# Patient Record
Sex: Male | Born: 1985 | Race: White | Hispanic: No | Marital: Married | Smoking: Former smoker
Health system: Southern US, Community
[De-identification: ages and names within clinical notes are randomized; demographics above are authoritative.]

## PROBLEM LIST (undated history)

## (undated) DIAGNOSIS — J45909 Unspecified asthma, uncomplicated: Secondary | ICD-10-CM

---
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
## 2005-03-19 ENCOUNTER — Emergency Department (HOSPITAL_COMMUNITY): Admission: EM | Admit: 2005-03-19 | Discharge: 2005-03-19 | Payer: Self-pay | Admitting: Emergency Medicine
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
## 2007-07-31 ENCOUNTER — Emergency Department (HOSPITAL_COMMUNITY): Admission: EM | Admit: 2007-07-31 | Discharge: 2007-07-31 | Payer: Self-pay | Admitting: Emergency Medicine

## 2010-07-25 NOTE — Discharge Summary (Signed)
   NAME:  Devin Murphy, Devin Murphy                           ACCOUNT NO.:  [id number]   MEDICAL RECORD NO.:  [id number]                   PATIENT TYPE:  INP   LOCATION:  A315                                 FACILITY:  [facility]   PHYSICIAN:  Kingsley Callander. Ouida Sills, M.D.                  DATE OF BIRTH:  [birth year]   DATE OF ADMISSION:  03/28/2002  DATE OF DISCHARGE:  03/29/2002                                 DISCHARGE SUMMARY   DISCHARGE DIAGNOSES:  1. Acute gastroenteritis.  2. Dehydration.   HOSPITAL COURSE:  This patient is a 25 year old white male who presented to  the emergency room with nausea, vomiting and diarrhea.  His white count was  16.4.  His BUN and creatinine were 15 and 0.8.  His AST was mildly elevated  at 46.  His ALT was normal at 32.  His urinalysis was negative.  He had a  rapid Strep test which was negative.  He was felt to likely have an acute  gastroenteritis.  He was hydrated with 3 L in the emergency room over an  eight-hour period and remained orthostatic; he was therefore hospitalized  for observation and further treatment.  With IV hydration, his symptoms  improved.  His diet was advanced from clear liquids to full liquids, which  he tolerated well.  He was improved and stable for discharge on March 29, 2001.   He underwent a CT scan of the abdomen while in the emergency room, which  revealed a few mildly enlarged mesenteric lymph nodes possibly consistent  with mesenteric adenitis.  An acute abdominal series had been unremarkable.   He will follow up in the office as needed.   DISCHARGE MEDICATIONS:  He was given a prescription for Phenergan 25 mg  q.4h. p.r.n.   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                                               Kingsley Callander. Ouida Sills, M.D.    ROF/MEDQ  D:  03/29/2002  T:  03/30/2002  Job:  [id number]

## 2010-07-25 NOTE — H&P (Signed)
NAME:  Devin Murphy, Devin Murphy                           ACCOUNT NO.:  [id number]   MEDICAL RECORD NO.:  [id number]                   PATIENT TYPE:  INP   LOCATION:  A315                                 FACILITY:  [facility]   PHYSICIAN:  Kingsley Callander. Ouida Sills, M.D.                  DATE OF BIRTH:  1985-09-07   DATE OF ADMISSION:  03/27/2002  DATE OF DISCHARGE:                                HISTORY & PHYSICAL   CHIEF COMPLAINT:  Vomiting.   HISTORY OF PRESENT ILLNESS:  This patient is a 25 year old white male who  presented to the emergency room with nausea, vomiting, and diarrhea.  He had  eaten a taco at a local [employer] on the day prior to admission and  began feeling ill shortly thereafter.  He vomited multiple times.  He  experienced diarrhea and abdominal pain.  He presented to the emergency room  where he was found to have a leukocytosis and evidence of orthostasis.  He  underwent a CT scan which revealed probable mesenteric adenitis.  Despite  hydration, he remained orthostatic and was felt to require further treatment  in the hospital.   PAST MEDICAL HISTORY:  Unremarkable.   MEDICATIONS:  None.   ALLERGIES:  AMOXICILLIN.   SOCIAL HISTORY:  He does not smoke cigarettes, drink alcohol, or use drugs.   FAMILY HISTORY:  His father has had peptic ulcer disease.   REVIEW OF SYSTEMS:  Noncontributory.   PHYSICAL EXAMINATION:  VITAL SIGNS:  Temperature 97.9, pulse 100,  respirations 20, blood pressure 142/67.  GENERAL:  Weak, ill-appearing, young white male.  HEENT:  No scleral icterus.  Oropharynx appears dry.  NECK:  Supple with no thyromegaly or lymphadenopathy.  LUNGS:  Clear.  HEART:  Tachycardic with no murmurs.  ABDOMEN:  Mildly tender diffusely with no hepatosplenomegaly or palpable  mass.  No CVA tenderness.  EXTREMITIES:  Normal pulses.  No cyanosis, clubbing, or edema.  NEUROLOGIC:  Grossly intact.   LABORATORY DATA:  White count 16.4, hemoglobin 16.1, platelets 165.   Sodium  134, potassium 5.4, BUN 15, creatinine 0.8, bicarb 24.  SGOT 46, SGPT 32,  amylase 72.  Urinalysis is negative.  Strep test which was negative.   An acute abdominal series is unremarkable.  CT scan reveals probable  mesenteric adenitis.   IMPRESSION:  Gastroenteritis versus mesenteric adenitis.  His symptoms  certainly are consistent with a bout of acute gastroenteritis with  associated dehydration.  Despite 3 L of fluids over an eight-hour period in  the emergency room, he has remained orthostatic and will require  hospitalization for observation and further intravenous hydration.  He will  be treated with Phenergan on a p.r.n. basis and with Demerol on a p.r.n.  basis.  He developed a red streak from his IV site after being given an  injection of morphine it the emergency room.  He will be  allowed to try  clear liquids and his diet will be advanced later if tolerated.                                               Kingsley Callander. Ouida Sills, M.D.    ROF/MEDQ  D:  03/29/2002  T:  03/29/2002  Job:  [id number]

## 2014-02-14 ENCOUNTER — Encounter (HOSPITAL_COMMUNITY): Payer: Self-pay | Admitting: Emergency Medicine

## 2014-02-14 ENCOUNTER — Emergency Department (HOSPITAL_COMMUNITY)
Admission: EM | Admit: 2014-02-14 | Discharge: 2014-02-14 | Disposition: A | Discharge status: Home / Self Care | Payer: 59 | Attending: Emergency Medicine | Admitting: Emergency Medicine

## 2014-02-14 ENCOUNTER — Emergency Department (HOSPITAL_COMMUNITY)
Admission: EM | Admit: 2014-02-14 | Discharge: 2014-02-14 | Disposition: A | Discharge status: Nursing Home (Includes ICF) | Payer: 59 | Source: Home / Self Care | Attending: Family Medicine | Admitting: Family Medicine

## 2014-02-14 ENCOUNTER — Encounter (HOSPITAL_COMMUNITY): Payer: Self-pay | Admitting: *Deleted

## 2014-02-14 ENCOUNTER — Emergency Department (HOSPITAL_COMMUNITY): Payer: 59

## 2014-02-14 DIAGNOSIS — R509 Fever, unspecified: Secondary | ICD-10-CM

## 2014-02-14 DIAGNOSIS — R079 Chest pain, unspecified: Secondary | ICD-10-CM | POA: Insufficient documentation

## 2014-02-14 DIAGNOSIS — J45901 Unspecified asthma with (acute) exacerbation: Secondary | ICD-10-CM | POA: Diagnosis not present

## 2014-02-14 DIAGNOSIS — R059 Cough, unspecified: Secondary | ICD-10-CM

## 2014-02-14 DIAGNOSIS — R0602 Shortness of breath: Secondary | ICD-10-CM

## 2014-02-14 DIAGNOSIS — J069 Acute upper respiratory infection, unspecified: Secondary | ICD-10-CM

## 2014-02-14 DIAGNOSIS — Z880 Allergy status to penicillin: Secondary | ICD-10-CM | POA: Diagnosis not present

## 2014-02-14 DIAGNOSIS — I951 Orthostatic hypotension: Secondary | ICD-10-CM

## 2014-02-14 DIAGNOSIS — Z79899 Other long term (current) drug therapy: Secondary | ICD-10-CM | POA: Diagnosis not present

## 2014-02-14 DIAGNOSIS — A419 Sepsis, unspecified organism: Secondary | ICD-10-CM

## 2014-02-14 DIAGNOSIS — R000 Tachycardia, unspecified: Secondary | ICD-10-CM

## 2014-02-14 DIAGNOSIS — J029 Acute pharyngitis, unspecified: Secondary | ICD-10-CM

## 2014-02-14 DIAGNOSIS — Z87891 Personal history of nicotine dependence: Secondary | ICD-10-CM | POA: Diagnosis not present

## 2014-02-14 DIAGNOSIS — R05 Cough: Secondary | ICD-10-CM

## 2014-02-14 DIAGNOSIS — R6510 Systemic inflammatory response syndrome (SIRS) of non-infectious origin without acute organ dysfunction: Secondary | ICD-10-CM

## 2014-02-14 HISTORY — DX: Unspecified asthma, uncomplicated: J45.909

## 2014-02-14 LAB — CBC WITH DIFFERENTIAL/PLATELET
BASOS ABS: 0 10*3/uL (ref 0.0–0.1)
Basophils Relative: 0 % (ref 0–1)
EOS PCT: 1 % (ref 0–5)
Eosinophils Absolute: 0.2 10*3/uL (ref 0.0–0.7)
HCT: 40.5 % (ref 39.0–52.0)
Hemoglobin: 14.1 g/dL (ref 13.0–17.0)
Lymphocytes Relative: 18 % (ref 12–46)
Lymphs Abs: 2.1 10*3/uL (ref 0.7–4.0)
MCH: 29.9 pg (ref 26.0–34.0)
MCHC: 34.8 g/dL (ref 30.0–36.0)
MCV: 86 fL (ref 78.0–100.0)
Monocytes Absolute: 1.3 10*3/uL — ABNORMAL HIGH (ref 0.1–1.0)
Monocytes Relative: 11 % (ref 3–12)
Neutro Abs: 8.2 10*3/uL — ABNORMAL HIGH (ref 1.7–7.7)
Neutrophils Relative %: 70 % (ref 43–77)
PLATELETS: 168 10*3/uL (ref 150–400)
RBC: 4.71 MIL/uL (ref 4.22–5.81)
RDW: 11.7 % (ref 11.5–15.5)
WBC: 11.8 10*3/uL — ABNORMAL HIGH (ref 4.0–10.5)

## 2014-02-14 LAB — POCT URINALYSIS DIP (DEVICE)
Bilirubin Urine: NEGATIVE
GLUCOSE, UA: NEGATIVE mg/dL
Ketones, ur: NEGATIVE mg/dL
Leukocytes, UA: NEGATIVE
Nitrite: NEGATIVE
PROTEIN: NEGATIVE mg/dL
SPECIFIC GRAVITY, URINE: 1.02 (ref 1.005–1.030)
Urobilinogen, UA: 2 mg/dL — ABNORMAL HIGH (ref 0.0–1.0)
pH: 7 (ref 5.0–8.0)

## 2014-02-14 LAB — COMPREHENSIVE METABOLIC PANEL
ALT: 22 U/L (ref 0–53)
AST: 22 U/L (ref 0–37)
Albumin: 3.9 g/dL (ref 3.5–5.2)
Alkaline Phosphatase: 111 U/L (ref 39–117)
Anion gap: 15 (ref 5–15)
BILIRUBIN TOTAL: 0.3 mg/dL (ref 0.3–1.2)
BUN: 14 mg/dL (ref 6–23)
CO2: 22 meq/L (ref 19–32)
Calcium: 9.2 mg/dL (ref 8.4–10.5)
Chloride: 100 mEq/L (ref 96–112)
Creatinine, Ser: 1.04 mg/dL (ref 0.50–1.35)
GFR calc Af Amer: >90 mL/min (ref >90)
Glucose, Bld: 104 mg/dL — ABNORMAL HIGH (ref 70–99)
Potassium: 3.6 mEq/L — ABNORMAL LOW (ref 3.7–5.3)
SODIUM: 137 meq/L (ref 137–147)
Total Protein: 7.2 g/dL (ref 6.0–8.3)

## 2014-02-14 LAB — I-STAT CG4 LACTIC ACID, ED: LACTIC ACID, VENOUS: 0.84 mmol/L (ref 0.5–2.2)

## 2014-02-14 MED ORDER — IBUPROFEN 800 MG PO TABS
ORAL_TABLET | ORAL | Status: AC
  Filled 2014-02-14: qty 1

## 2014-02-14 MED ORDER — IBUPROFEN 800 MG PO TABS
800.0000 mg | ORAL_TABLET | Freq: Once | ORAL | Status: AC
  Administered 2014-02-14: 800 mg via ORAL

## 2014-02-14 MED ORDER — DEXAMETHASONE 4 MG PO TABS
10.0000 mg | ORAL_TABLET | Freq: Once | ORAL | Status: AC
  Administered 2014-02-14: 10 mg via ORAL
  Filled 2014-02-14: qty 3

## 2014-02-14 NOTE — ED Notes (Signed)
[facility] , reports fever with productive cough , SOB and palpitations onset this week .

## 2014-02-14 NOTE — ED Provider Notes (Signed)
CSN: [id number]     Arrival date & time 02/14/14  2027 History   First MD Initiated Contact with Patient 02/14/14 2213     Chief Complaint  Patient presents with  . Fever     (Consider location/radiation/quality/duration/timing/severity/associated sxs/prior Treatment) Patient is a 28 y.o. male presenting with cough.  Cough Cough characteristics:  Non-productive Severity:  Moderate Onset quality:  Gradual Duration:  1 week Timing:  Constant Progression:  Worsening Chronicity:  New Smoker: yes   Context: upper respiratory infection   Relieved by:  Nothing Worsened by:  Nothing tried Ineffective treatments:  None tried Associated symptoms: chest pain (with coughing and deep breaths), fever (103.2 tmax), shortness of breath and sinus congestion     Past Medical History  Diagnosis Date  . Asthma     as a child   History reviewed. No pertinent past surgical history. Family History  Problem Relation Age of Onset  . Diabetes Father    History  Substance Use Topics  . Smoking status: Former Smoker    Types: Cigarettes    Quit date: 02/09/2014  . Smokeless tobacco: Not on file  . Alcohol Use: Yes     Comment: occ.    Review of Systems  Constitutional: Positive for fever (103.2 tmax).  Respiratory: Positive for cough and shortness of breath.   Cardiovascular: Positive for chest pain (with coughing and deep breaths).  All other systems reviewed and are negative.     Allergies  Penicillins and Morphine and related  Home Medications   Prior to Admission medications   Medication Sig Start Date End Date Taking? Authorizing Provider  acetaminophen (TYLENOL) 325 MG tablet Take 975 mg by mouth every 6 (six) hours as needed.   Yes Historical Provider, MD   BP 131/73 mmHg  Pulse 86  Temp(Src) 99 F (37.2 C)  Resp 12  Wt 243 lb 1 oz (110.252 kg)  SpO2 97% Physical Exam  Constitutional: He is oriented to person, place, and time. He appears well-developed and  well-nourished.  HENT:  Head: Normocephalic and atraumatic.  Eyes: Conjunctivae and EOM are normal.  Neck: Normal range of motion. Neck supple.  Cardiovascular: Normal rate, regular rhythm and normal heart sounds.   Pulmonary/Chest: Effort normal and breath sounds normal. No respiratory distress.  Abdominal: He exhibits no distension. There is no tenderness. There is no rebound and no guarding.  Musculoskeletal: Normal range of motion.  Neurological: He is alert and oriented to person, place, and time.  Skin: Skin is warm and dry.  Vitals reviewed.   ED Course  Procedures (including critical care time) Labs Review Labs Reviewed  COMPREHENSIVE METABOLIC PANEL - Abnormal; Notable for the following:    Potassium 3.6 (*)    Glucose, Bld 104 (*)    All other components within normal limits  CBC WITH DIFFERENTIAL - Abnormal; Notable for the following:    WBC 11.8 (*)    Neutro Abs 8.2 (*)    Monocytes Absolute 1.3 (*)    All other components within normal limits  I-STAT CG4 LACTIC ACID, ED    Imaging Review Dg Chest 2 View  02/14/2014   CLINICAL DATA:  Cough, fever, and shortness of breath for 1 week.  EXAM: CHEST  2 VIEW  COMPARISON:  Chest x-ray dated 05/01/2003  FINDINGS: The heart size and mediastinal contours are within normal limits. Both lungs are clear. Minimal thoracic scoliosis, unchanged. No effusions.  IMPRESSION: No acute abnormalities.   Electronically Signed   By:  Geanie CooleyJim  Maxwell M.D.   On: 02/14/2014 21:39     EKG Interpretation   Date/Time:  Wednesday February 14 2014 20:39:04 EST Ventricular Rate:  110 PR Interval:  130 QRS Duration: 94 QT Interval:  320 QTC Calculation: 433 R Axis:   52 Text Interpretation:  Sinus tachycardia Otherwise normal ECG No old  tracing to compare Confirmed by Mirian MoGentry, Mickey Esguerra [phone]([id number]) on 02/14/2014  10:14:14 PM      MDM   Final diagnoses:  Cough  SOB (shortness of breath)  Fever    28 y.o. male with pertinent PMH of asthma  as a child presents from urgent care with concern for pneumonia. His history is concerning for the same, he developed symptoms last week which improved, worsened 3 days ago. Over the last 3 days he's had increasing fever to a MAXIMUM TEMPERATURE of 103.2, nonproductive cough, sore throat.  He was seen in urgent care and due to concern for tachycardia of 130 was sent over for further evaluation. He took a Tylenol and his temperature improved, tachycardia resolved. On arrival today vitals signs and physical exam as above. Completely benign. Chest x-ray without signs of pneumonia. Patient had a strep screen which was negative earlier today. He does have some signs of pharyngitis. Given Decadron for same. Discussed need for follow-up in 2 days due to occult pneumonia possibility, family and patient voiced understanding and agreed to follow-up.    1. Upper respiratory infection   2. Cough   3. SOB (shortness of breath)   4. Fever   5. Pharyngitis         Mirian MoMatthew Maruice Pieroni, MD 02/14/14 2236

## 2014-02-14 NOTE — ED Provider Notes (Signed)
CSN: [id number]     Arrival date & time 02/14/14  1825 History   First MD Initiated Contact with Patient 02/14/14 1942     Chief Complaint  Patient presents with  . Fever   (Consider location/radiation/quality/duration/timing/severity/associated sxs/prior Treatment) HPI          28 year old male presents for evaluation of being sick for one week. He initially had a cough that was mild last week along with mild shortness of breath. This got worse for a few days and then he seemed to get better. 3 days ago the cough returned. It has gotten very severe and he is also developed a high fever for the past 3 days. His temperature has been up to 103F at home. Additionally he admits to nausea and extreme lightheadedness. His cough is productive of sputum. He has palpitations and feeling of a rapid heartbeat. He feels short of breath. He has diffuse body aches. Denies any vomiting. No recent travel or sick contacts. He also notes that he recently has been vaping a lot, he is worried that this may have caused pneumonia.  Past Medical History  Diagnosis Date  . Asthma     as a child   History reviewed. No pertinent past surgical history. Family History  Problem Relation Age of Onset  . Diabetes Father    History  Substance Use Topics  . Smoking status: Former Smoker    Types: Cigarettes    Quit date: 02/09/2014  . Smokeless tobacco: Not on file  . Alcohol Use: Yes     Comment: occ.    Review of Systems  Constitutional: Positive for fever, chills, appetite change and fatigue.  HENT: Positive for congestion, sinus pressure and sore throat. Negative for ear pain.   Respiratory: Positive for cough, chest tightness and shortness of breath. Negative for wheezing.   Cardiovascular: Positive for chest pain and palpitations. Negative for leg swelling.  Gastrointestinal: Positive for nausea. Negative for vomiting, abdominal pain and diarrhea.  Endocrine: Negative for polydipsia and polyuria.   Genitourinary: Negative for dysuria, urgency, frequency, hematuria, flank pain, discharge and testicular pain.  Musculoskeletal: Negative for back pain, neck pain and neck stiffness.  Skin: Negative for rash.  Neurological: Positive for headaches. Negative for weakness.  All other systems reviewed and are negative.   Allergies  Penicillins and Morphine and related  Home Medications   Prior to Admission medications   Medication Sig Start Date End Date Taking? Authorizing Provider  acetaminophen (TYLENOL) 325 MG tablet Take 975 mg by mouth every 6 (six) hours as needed.   Yes Historical Provider, MD   BP 134/78 mmHg  Pulse 132  Temp(Src) 102.9 F (39.4 C) (Oral)  Resp 18  SpO2 95% Physical Exam  Constitutional: He is oriented to person, place, and time. He appears well-developed and well-nourished. No distress.  HENT:  Head: Normocephalic and atraumatic.  Right Ear: Tympanic membrane, external ear and ear canal normal.  Left Ear: Tympanic membrane, external ear and ear canal normal.  Mouth/Throat: Posterior oropharyngeal erythema (mild erythema without exudate) present.  Cardiovascular: Regular rhythm, normal heart sounds and normal pulses.  Tachycardia present.   Pulmonary/Chest: Effort normal. Tachypnea noted. No respiratory distress. He has no wheezes. He has rales in the left lower field.  Neurological: He is alert and oriented to person, place, and time. Coordination normal.  Skin: Skin is warm and dry. No rash noted. He is not diaphoretic.  Psychiatric: He has a normal mood and affect. Judgment normal.  Nursing note and vitals reviewed.   ED Course  Procedures (including critical care time) Labs Review Labs Reviewed  POCT URINALYSIS DIP (DEVICE) - Abnormal; Notable for the following:    Hgb urine dipstick TRACE (*)    Urobilinogen, UA 2.0 (*)    All other components within normal limits    Imaging Review No results found.   MDM   1. SIRS (systemic  inflammatory response syndrome)   2. Fever, unspecified fever cause   3. Tachycardia   4. Orthostasis    This patient's symptoms are concerning for pneumonia. He is febrile, tachycardic, has orthostatic dizziness. The concern here is for sepsis, as it stands he meets criteria for SIRS. I suggested we start IV fluids and transferred to the emergency department via EMS, patient declines. He is willing to accept transfer to the emergency department via shuttle. The emergency department has been alerted to this patient's condition. He is being transferred via shuttle  Graylon GoodZachary H Brendaliz Kuk, PA-C 02/14/14 1953

## 2014-02-14 NOTE — Discharge Instructions (Signed)
Upper Respiratory Infection, Adult An upper respiratory infection (URI) is also sometimes known as the common cold. The upper respiratory tract includes the nose, sinuses, throat, trachea, and bronchi. Bronchi are the airways leading to the lungs. Most people improve within 1 week, but symptoms can last up to 2 weeks. A residual cough may last even longer.  CAUSES Many different viruses can infect the tissues lining the upper respiratory tract. The tissues become irritated and inflamed and often become very moist. Mucus production is also common. A cold is contagious. You can easily spread the virus to others by oral contact. This includes kissing, sharing a glass, coughing, or sneezing. Touching your mouth or nose and then touching a surface, which is then touched by another person, can also spread the virus. SYMPTOMS  Symptoms typically develop 1 to 3 days after you come in contact with a cold virus. Symptoms vary from person to person. They may include:  Runny nose.  Sneezing.  Nasal congestion.  Sinus irritation.  Sore throat.  Loss of voice (laryngitis).  Cough.  Fatigue.  Muscle aches.  Loss of appetite.  Headache.  Low-grade fever. DIAGNOSIS  You might diagnose your own cold based on familiar symptoms, since most people get a cold 2 to 3 times a year. Your caregiver can confirm this based on your exam. Most importantly, your caregiver can check that your symptoms are not due to another disease such as strep throat, sinusitis, pneumonia, asthma, or epiglottitis. Blood tests, throat tests, and X-rays are not necessary to diagnose a common cold, but they may sometimes be helpful in excluding other more serious diseases. Your caregiver will decide if any further tests are required. RISKS AND COMPLICATIONS  You may be at risk for a more severe case of the common cold if you smoke cigarettes, have chronic heart disease (such as heart failure) or lung disease (such as asthma), or if  you have a weakened immune system. The very young and very old are also at risk for more serious infections. Bacterial sinusitis, middle ear infections, and bacterial pneumonia can complicate the common cold. The common cold can worsen asthma and chronic obstructive pulmonary disease (COPD). Sometimes, these complications can require emergency medical care and may be life-threatening. PREVENTION  The best way to protect against getting a cold is to practice good hygiene. Avoid oral or hand contact with people with cold symptoms. Wash your hands often if contact occurs. There is no clear evidence that vitamin C, vitamin E, echinacea, or exercise reduces the chance of developing a cold. However, it is always recommended to get plenty of rest and practice good nutrition. TREATMENT  Treatment is directed at relieving symptoms. There is no cure. Antibiotics are not effective, because the infection is caused by a virus, not by bacteria. Treatment may include:  Increased fluid intake. Sports drinks offer valuable electrolytes, sugars, and fluids.  Breathing heated mist or steam (vaporizer or shower).  Eating chicken soup or other clear broths, and maintaining good nutrition.  Getting plenty of rest.  Using gargles or lozenges for comfort.  Controlling fevers with ibuprofen or acetaminophen as directed by your caregiver.  Increasing usage of your inhaler if you have asthma. Zinc gel and zinc lozenges, taken in the first 24 hours of the common cold, can shorten the duration and lessen the severity of symptoms. Pain medicines may help with fever, muscle aches, and throat pain. A variety of non-prescription medicines are available to treat congestion and runny nose. Your caregiver   can make recommendations and may suggest nasal or lung inhalers for other symptoms.  HOME CARE INSTRUCTIONS   Only take over-the-counter or prescription medicines for pain, discomfort, or fever as directed by your  caregiver.  Use a warm mist humidifier or inhale steam from a shower to increase air moisture. This may keep secretions moist and make it easier to breathe.  Drink enough water and fluids to keep your urine clear or pale yellow.  Rest as needed.  Return to work when your temperature has returned to normal or as your caregiver advises. You may need to stay home longer to avoid infecting others. You can also use a face mask and careful hand washing to prevent spread of the virus. SEEK MEDICAL CARE IF:   After the first few days, you feel you are getting worse rather than better.  You need your caregiver's advice about medicines to control symptoms.  You develop chills, worsening shortness of breath, or brown or red sputum. These may be signs of pneumonia.  You develop yellow or brown nasal discharge or pain in the face, especially when you bend forward. These may be signs of sinusitis.  You develop a fever, swollen neck glands, pain with swallowing, or white areas in the back of your throat. These may be signs of strep throat. SEEK IMMEDIATE MEDICAL CARE IF:   You have a fever.  You develop severe or persistent headache, ear pain, sinus pain, or chest pain.  You develop wheezing, a prolonged cough, cough up blood, or have a change in your usual mucus (if you have chronic lung disease).  You develop sore muscles or a stiff neck. Document Released: 08/19/2000 Document Revised: 05/18/2011 Document Reviewed: 05/31/2013 [employer] Patient Information 2015 [employer]. This information is not intended to replace advice given to you by your health care provider. Make sure you discuss any questions you have with your health care provider.  

## 2014-02-14 NOTE — ED Notes (Addendum)
C/o sore throat and fever x 1 week with nausea. SOB, headache and rapid heart rate today.  C/o a lot mucous in his throat.  Occ. Cough that is prod. When his fever gets up, his joints ache.

## 2014-02-16 LAB — CULTURE, GROUP A STREP

## 2014-02-17 NOTE — ED Notes (Signed)
Final report step B negative, no further action required

## 2016-04-08 IMAGING — DX DG CHEST 2V
2 series · 2 of 2 positions shown · non-contrast
Comparison: Chest x-ray dated 05/01/2003

CLINICAL DATA: Cough, fever, and shortness of breath for 1 week.

EXAM:
CHEST  2 VIEW

[chest pa]
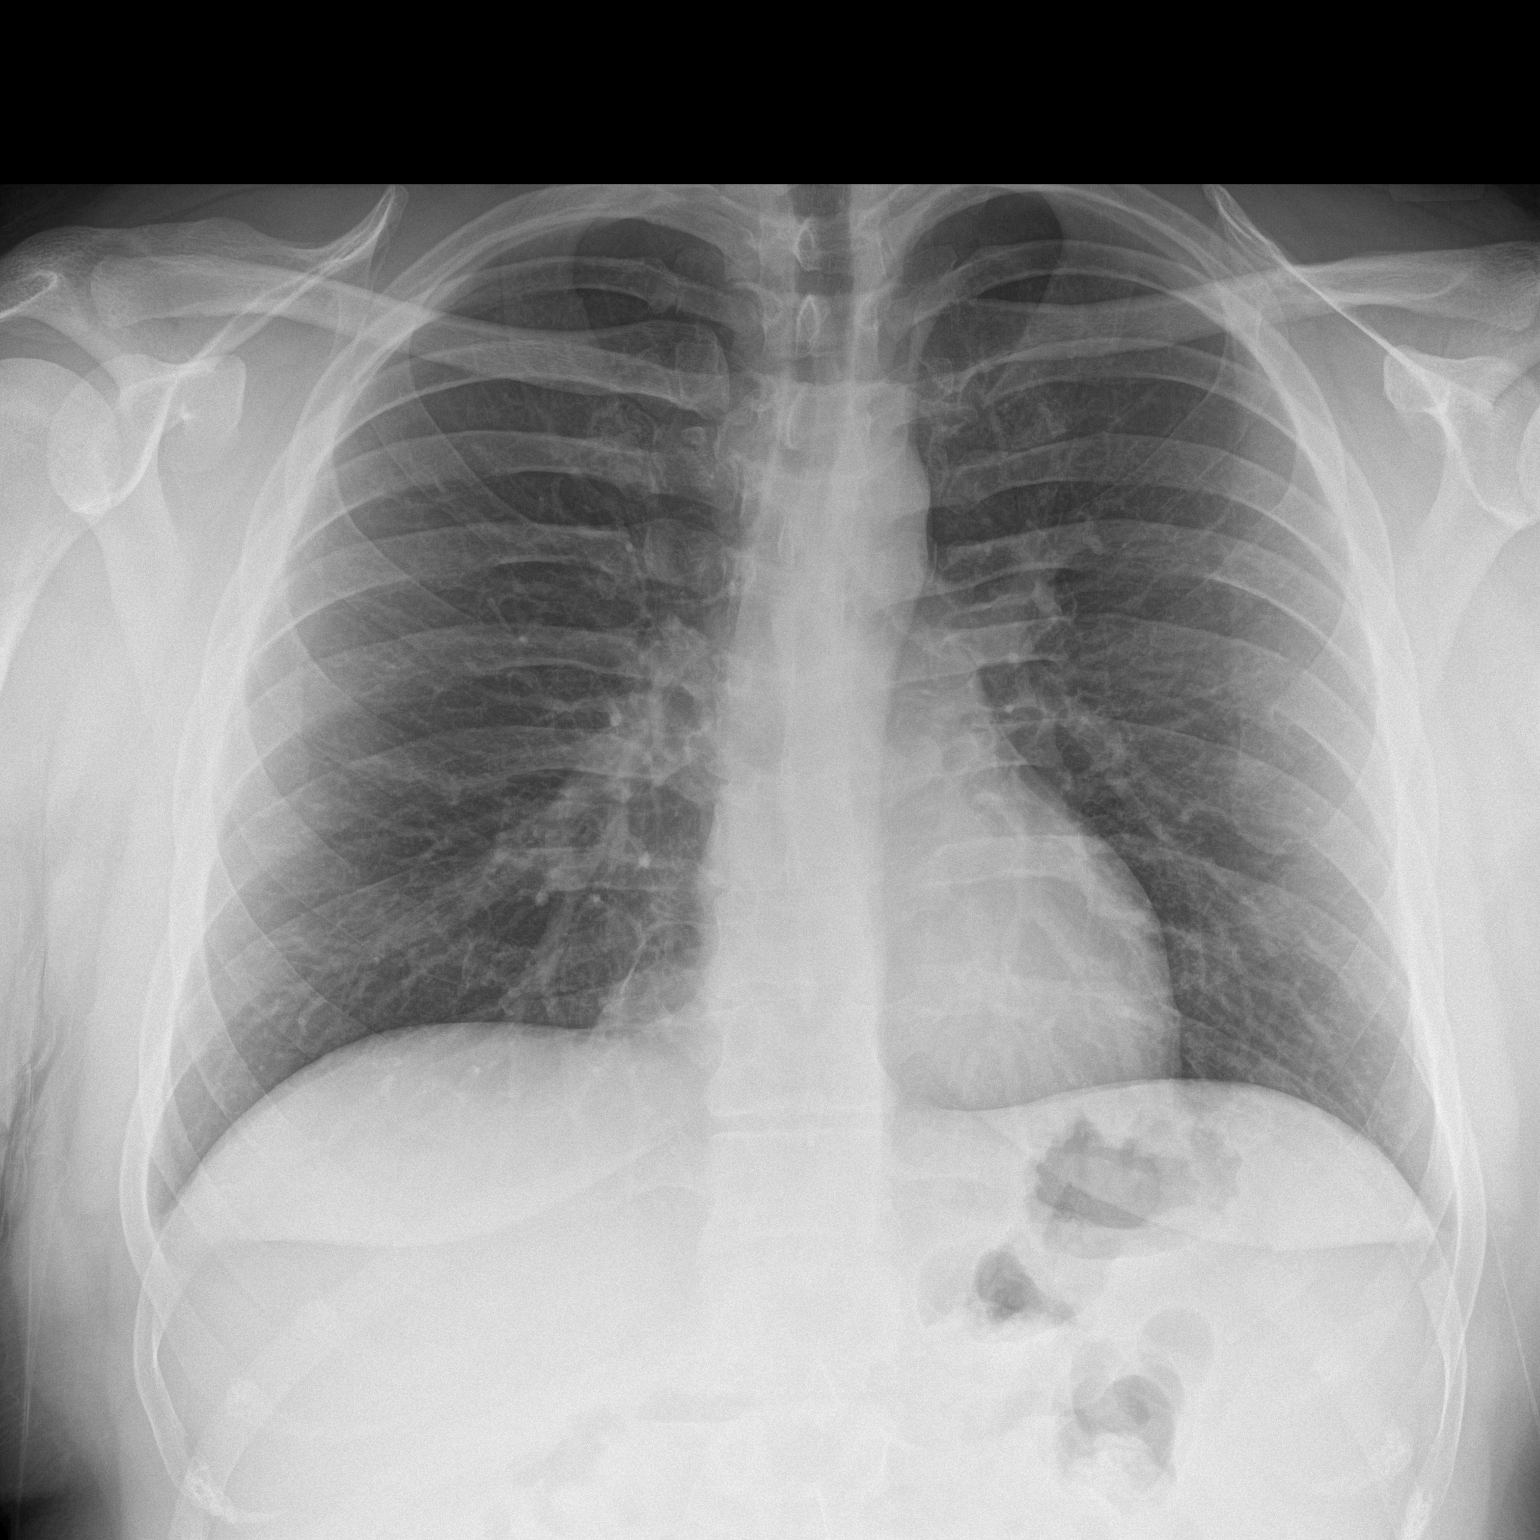

[chest lat]
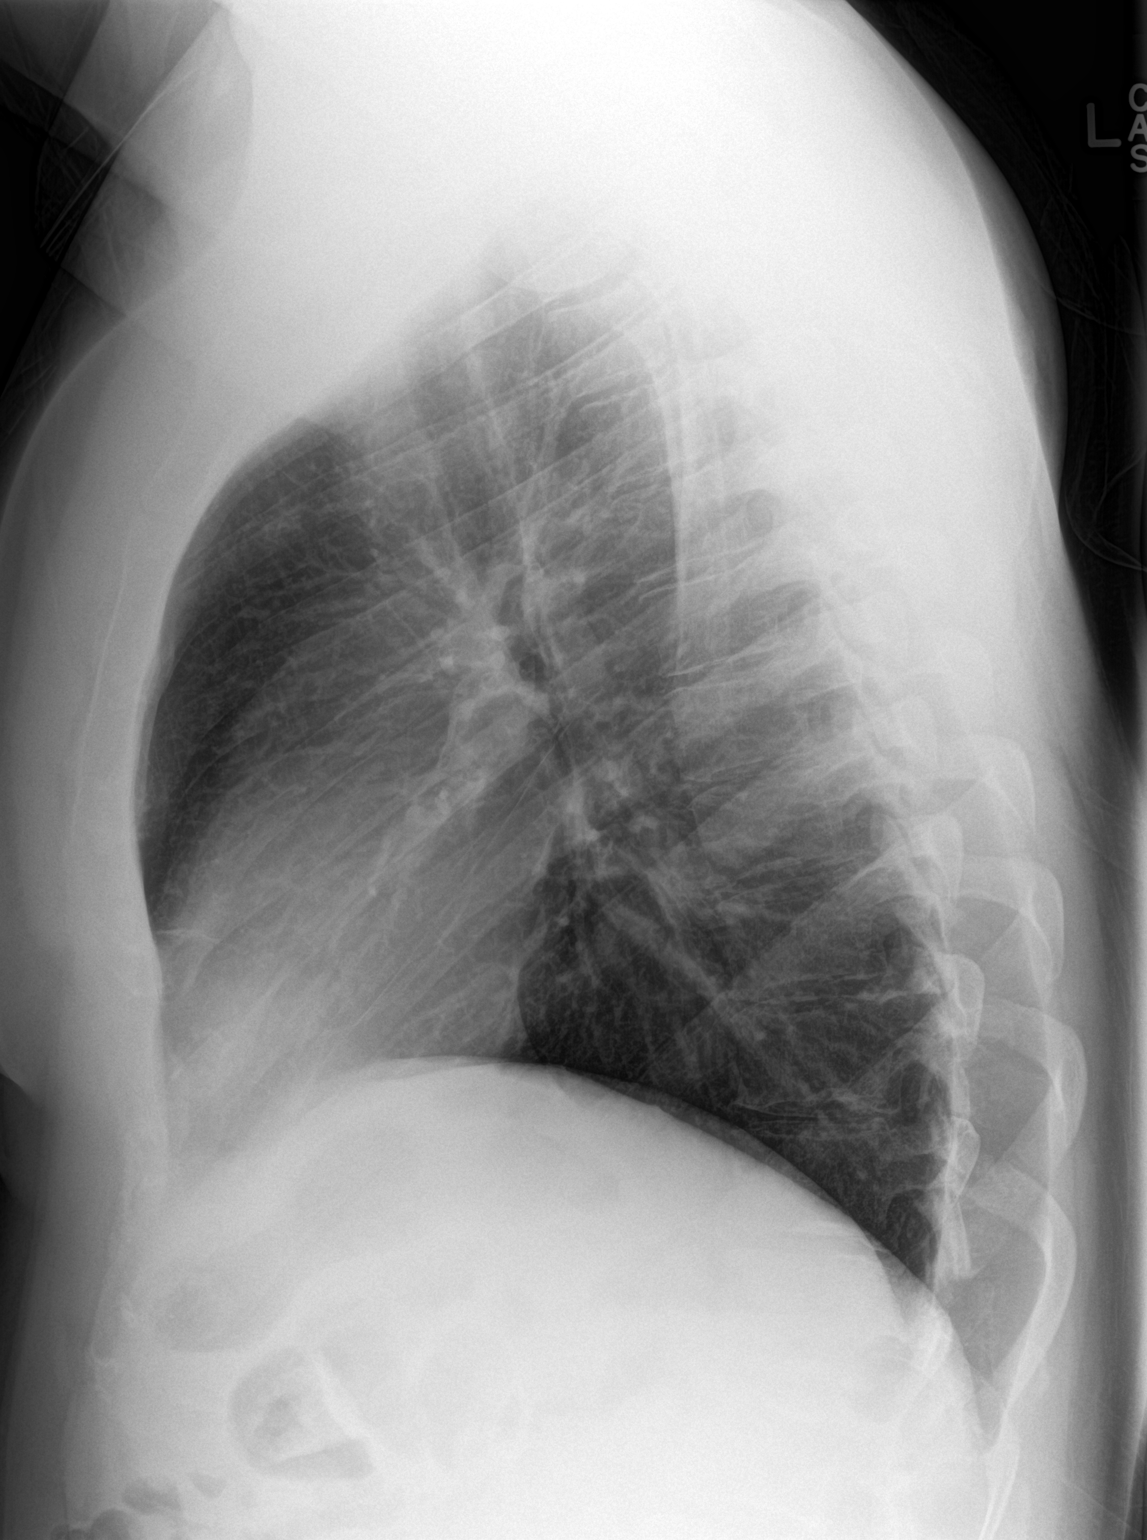

[2 of 2 positions shown; findings below may reference images not displayed]

FINDINGS: The heart size and mediastinal contours are within normal limits.
Both lungs are clear. Minimal thoracic scoliosis, unchanged. No
effusions.
IMPRESSION: No acute abnormalities.

## 2019-02-18 ENCOUNTER — Ambulatory Visit: Admission: EM | Admit: 2019-02-18 | Discharge: 2019-02-18 | Discharge status: Against Medical Advice | Payer: Self-pay

## 2019-02-18 ENCOUNTER — Other Ambulatory Visit: Payer: Self-pay

## 2019-02-27 ENCOUNTER — Other Ambulatory Visit: Payer: Self-pay

## 2019-02-27 ENCOUNTER — Ambulatory Visit
Admission: EM | Admit: 2019-02-27 | Discharge: 2019-02-27 | Disposition: A | Discharge status: Home / Self Care | Payer: Self-pay | Attending: Emergency Medicine | Admitting: Emergency Medicine

## 2019-02-27 DIAGNOSIS — R5383 Other fatigue: Secondary | ICD-10-CM

## 2019-02-27 DIAGNOSIS — J029 Acute pharyngitis, unspecified: Secondary | ICD-10-CM

## 2019-02-27 DIAGNOSIS — M791 Myalgia, unspecified site: Secondary | ICD-10-CM

## 2019-02-27 DIAGNOSIS — R519 Headache, unspecified: Secondary | ICD-10-CM

## 2019-02-27 DIAGNOSIS — Z20822 Contact with and (suspected) exposure to covid-19: Secondary | ICD-10-CM

## 2019-02-27 DIAGNOSIS — Z20828 Contact with and (suspected) exposure to other viral communicable diseases: Secondary | ICD-10-CM

## 2019-02-27 MED ORDER — IBUPROFEN 600 MG PO TABS: 600.0000 mg | ORAL_TABLET | Freq: Four times a day (QID) | ORAL | 0 refills | Status: AC | PRN

## 2019-02-27 NOTE — Discharge Instructions (Addendum)
1 gram of Tylenol and 600 mg ibuprofen together 3-4 times a day as needed for pain, headaches, body aches.  Make sure you drink plenty of extra fluids.  Some people find salt water gargles and  Traditional Medicinal's "Throat Coat" tea helpful. Take 5 mL of liquid Benadryl and 5 mL of Maalox. Mix it together, and then hold it in your mouth for as long as you can and then swallow. You may do this 4 times a day.    Go to www.goodrx.com to look up your medications. This will give you a list of where you can find your prescriptions at the most affordable prices. Or ask the pharmacist what the cash price is, or if they have any other discount programs available to help make your medication more affordable. This can be less expensive than what you would pay with insurance.

## 2019-02-27 NOTE — ED Triage Notes (Signed)
Pt presents to UC stating he has had a covid positive exposure. Pt c/o sore throat and cough x5 days. Pt c/o cough this morning.

## 2019-02-27 NOTE — ED Provider Notes (Addendum)
HPI  SUBJECTIVE:  Devin Murphy is a 33 y.o. male who presents with 4 days of body aches, fatigue, 2 days of headache, sore throat.  He reports a cough starting today and abdominal pain this morning, which has since resolved.  He states that there have been 5 outbreaks of Covid at work.  No fevers, nasal congestion loss of sense of taste or smell.  No shortness of breath, nausea no vomiting, diarrhea.  No antipyretic in the past 4 to 6 hours.  No antibiotics in the past month.  He has not tried anything for symptoms.  There are no aggravating or alleviating factors.  He had asthma in childhood, he is a former smoker quit 4-5 months ago.  He has a history of hypertension which resolved with 100 pound weight loss.  No history of diabetes, coronary disease, chronic kidney disease, HIV, immunocompromise, cancer.  PMD: None.    Past Medical History:  Diagnosis Date  . Asthma    as a child    History reviewed. No pertinent surgical history.  Family History  Problem Relation Age of Onset  . Healthy Mother   . Diabetes Father     Social History   Tobacco Use  . Smoking status: Former Smoker    Types: Cigarettes    Quit date: 02/09/2014    Years since quitting: 5.0  . Smokeless tobacco: Never Used  Substance Use Topics  . Alcohol use: Yes    Comment: occ.  . Drug use: No    No current facility-administered medications for this encounter.  Current Outpatient Medications:  .  acetaminophen (TYLENOL) 325 MG tablet, Take 975 mg by mouth every 6 (six) hours as needed., Disp: , Rfl:  .  ibuprofen (ADVIL) 600 MG tablet, Take 1 tablet (600 mg total) by mouth every 6 (six) hours as needed., Disp: 30 tablet, Rfl: 0  Allergies  Allergen Reactions  . Penicillins Rash  . Morphine And Related Other (See Comments)     Got it IV and it caused the vein to swell up.     ROS  As noted in HPI.   Physical Exam  BP 130/83 (BP Location: Right Arm)   Pulse 85   Temp 99.3 F (37.4 C) (Oral)    Resp 16   SpO2 95%   Constitutional: Well developed, well nourished, no acute distress Eyes:  EOMI, conjunctiva normal bilaterally HENT: Normocephalic, atraumatic,mucus membranes moist no nasal congestion, sinus tenderness.  Erythematous oropharynx, normal tonsils without exudates.  Uvula midline.  No postnasal drip. Neck: No cervical lymphadenopathy Respiratory: Normal inspiratory effort, lungs clear bilaterally Cardiovascular: Normal rate regular rhythm no murmurs rubs or gallops GI: nondistended skin: No rash, skin intact Musculoskeletal: no deformities Neurologic: Alert & oriented x 3, no focal neuro deficits Psychiatric: Speech and behavior appropriate   ED Course   Medications - No data to display  Orders Placed This Encounter  Procedures  . Novel Coronavirus, NAA ([facility])    Standing Status:   Standing    Number of Occurrences:   1    No results found for this or any previous visit (from the past 24 hour(s)). No results found.  ED Clinical Impression  1. Suspected COVID-19 virus infection   2. Encounter for laboratory testing for COVID-19 virus      ED Assessment/Plan  Covid PCR sent.  Supportive treatment including Tylenol/ibuprofen, Benadryl/Maalox mixture.  Patient declined prescription for cough.  Abdomen is benign.  Will provide primary care provider list for  follow-up.  To the ER if he gets worse.  Covid PCR positive for coronavirus. Sent pt message.  Discussed MDM, treatment plan, and plan for follow-up with patient. Discussed sn/sx that should prompt return to the ED. patient agrees with plan.   Meds ordered this encounter  Medications  . ibuprofen (ADVIL) 600 MG tablet    Sig: Take 1 tablet (600 mg total) by mouth every 6 (six) hours as needed.    Dispense:  30 tablet    Refill:  0    *This clinic note was created using Scientist, clinical (histocompatibility and immunogenetics). Therefore, there may be occasional mistakes despite careful proofreading.   ?   Domenick Gong, MD 03/01/19 1129    Domenick Gong, MD 03/01/19 [phone]

## 2019-03-01 ENCOUNTER — Telehealth: Payer: Self-pay | Admitting: Emergency Medicine

## 2019-03-01 ENCOUNTER — Encounter (HOSPITAL_COMMUNITY): Payer: Self-pay | Admitting: Emergency Medicine

## 2019-03-01 LAB — NOVEL CORONAVIRUS, NAA: SARS-CoV-2, NAA: DETECTED — AB

## 2019-03-01 NOTE — Telephone Encounter (Signed)
Your test for COVID-19 was positive, meaning that you were infected with the novel coronavirus and could give the germ to others.  Please continue isolation at home for at least 10 days since the start of your symptoms. If you do not have symptoms, please isolate at home for 10 days from the day you were tested. Once you complete your 10 day quarantine, you may return to normal activities as long as you've not had a fever for over 24 hours(without taking fever reducing medicine) and your symptoms are improving. Please continue good preventive care measures, including:  frequent hand-washing, avoid touching your face, cover coughs/sneezes, stay out of crowds and keep a 6 foot distance from others.  Go to the nearest hospital emergency room if fever/cough/breathlessness are severe or illness seems like a threat to life.  Patient contacted by phone and made aware of    results. Pt verbalized understanding and had all questions answered.  Quarantine ends Jan 1st

## 2019-03-02 ENCOUNTER — Encounter: Payer: Self-pay | Admitting: Infectious Diseases

## 2019-03-02 NOTE — Progress Notes (Signed)
Patient does not meet criteria for monoclonal antibody treatment for COVID+ outpatients due to lack of risk factors for severe disease.  

## 2019-03-08 ENCOUNTER — Other Ambulatory Visit: Payer: Self-pay

## 2019-03-08 ENCOUNTER — Ambulatory Visit: Payer: HRSA Program | Attending: Internal Medicine

## 2019-03-08 DIAGNOSIS — Z20822 Contact with and (suspected) exposure to covid-19: Secondary | ICD-10-CM

## 2019-03-08 DIAGNOSIS — Z20828 Contact with and (suspected) exposure to other viral communicable diseases: Secondary | ICD-10-CM | POA: Diagnosis present

## 2019-03-08 DIAGNOSIS — U071 COVID-19: Secondary | ICD-10-CM | POA: Diagnosis not present

## 2019-03-09 LAB — NOVEL CORONAVIRUS, NAA: SARS-CoV-2, NAA: DETECTED — AB

## 2022-02-12 ENCOUNTER — Telehealth: Payer: Self-pay | Admitting: *Deleted

## 2022-02-12 ENCOUNTER — Emergency Department (HOSPITAL_COMMUNITY)
Admission: EM | Admit: 2022-02-12 | Discharge: 2022-02-12 | Disposition: A | Discharge status: Home / Self Care | Payer: BC Managed Care – PPO | Attending: Emergency Medicine | Admitting: Emergency Medicine

## 2022-02-12 ENCOUNTER — Other Ambulatory Visit: Payer: Self-pay

## 2022-02-12 DIAGNOSIS — M109 Gout, unspecified: Secondary | ICD-10-CM | POA: Diagnosis not present

## 2022-02-12 DIAGNOSIS — R030 Elevated blood-pressure reading, without diagnosis of hypertension: Secondary | ICD-10-CM | POA: Insufficient documentation

## 2022-02-12 DIAGNOSIS — R7989 Other specified abnormal findings of blood chemistry: Secondary | ICD-10-CM | POA: Insufficient documentation

## 2022-02-12 LAB — I-STAT CHEM 8, ED
BUN: 26 mg/dL — ABNORMAL HIGH (ref 6–20)
Calcium, Ion: 1.12 mmol/L — ABNORMAL LOW (ref 1.15–1.40)
Chloride: 106 mmol/L (ref 98–111)
Creatinine, Ser: 1.4 mg/dL — ABNORMAL HIGH (ref 0.61–1.24)
Glucose, Bld: 100 mg/dL — ABNORMAL HIGH (ref 70–99)
HCT: 40 % (ref 39.0–52.0)
Hemoglobin: 13.6 g/dL (ref 13.0–17.0)
Potassium: 3.7 mmol/L (ref 3.5–5.1)
Sodium: 142 mmol/L (ref 135–145)
TCO2: 25 mmol/L (ref 22–32)

## 2022-02-12 MED ORDER — PREDNISONE 20 MG PO TABS
60.0000 mg | ORAL_TABLET | Freq: Once | ORAL | Status: AC
  Administered 2022-02-12: 60 mg via ORAL
  Filled 2022-02-12: qty 3

## 2022-02-12 MED ORDER — COLCHICINE 0.6 MG PO TABS
0.6000 mg | ORAL_TABLET | Freq: Once | ORAL | Status: AC
  Administered 2022-02-12: 0.6 mg via ORAL
  Filled 2022-02-12 (×2): qty 1

## 2022-02-12 MED ORDER — HYDROCODONE-ACETAMINOPHEN 5-325 MG PO TABS
1.0000 | ORAL_TABLET | Freq: Once | ORAL | Status: AC
  Administered 2022-02-12: 1 via ORAL
  Filled 2022-02-12: qty 1

## 2022-02-12 MED ORDER — PREDNISONE 20 MG PO TABS: 40.0000 mg | ORAL_TABLET | Freq: Every day | ORAL | 0 refills | Status: DC

## 2022-02-12 MED ORDER — COLCHICINE 0.6 MG PO TABS: 0.6000 mg | ORAL_TABLET | Freq: Two times a day (BID) | ORAL | 0 refills | Status: AC

## 2022-02-12 NOTE — ED Triage Notes (Signed)
Pt states he has a long history of gout, since age 36.  Pt states he has been managing well with diet and has not had a flare in about 2 years until yesterday.  Pain is "intolerable" at this time.

## 2022-02-12 NOTE — ED Provider Notes (Signed)
[facility] Provider Note MRN:  [id number]  Arrival date & time: 02/12/22     Chief Complaint   Gout   History of Present Illness   Devin Murphy is a 36 y.o. year-old male presents to the ED with chief complaint of gout in his left foot.  Onset was a couple of days ago.  He reports long history of gout.  Denies fever or injury.  States that he has been well managed with dietary changes.  Tried taking indomethacin without relief.  History provided by patient.   Review of Systems  Pertinent positive and negative review of systems noted in HPI.    Physical Exam   Vitals:   02/12/22 0056  BP: (!) 158/104  Pulse: 94  Resp: 18  Temp: 98.9 F (37.2 C)  SpO2: 98%    CONSTITUTIONAL:  non toxic-appearing, NAD NEURO:  Alert and oriented x 3, CN 3-12 grossly intact EYES:  eyes equal and reactive ENT/NECK:  Supple, no stridor  CARDIO:  appears well-perfused  PULM:  No respiratory distress,  GI/GU:  non-distended,  MSK/SPINE:  No gross deformities, no edema, moves all extremities  SKIN:  no rash, atraumatic, mild erythema and warmth to left midfoot   *Additional and/or pertinent findings included in MDM below  Diagnostic and Interventional Summary    EKG Interpretation  Date/Time:    Ventricular Rate:    PR Interval:    QRS Duration:   QT Interval:    QTC Calculation:   R Axis:     Text Interpretation:         Labs Reviewed  I-STAT CHEM 8, ED - Abnormal; Notable for the following components:      Result Value   BUN 26 (*)    Creatinine, Ser 1.40 (*)    Glucose, Bld 100 (*)    Calcium, Ion 1.12 (*)    All other components within normal limits    No orders to display    Medications  HYDROcodone-acetaminophen (NORCO/VICODIN) 5-325 MG per tablet 1 tablet (1 tablet Oral Given 02/12/22 0128)  predniSONE (DELTASONE) tablet 60 mg (60 mg Oral Given 02/12/22 0127)  colchicine tablet 0.6 mg (0.6 mg Oral Given 02/12/22 0228)      Procedures  /  Critical Care Procedures  ED Course and Medical Decision Making  I have reviewed the triage vital signs, the nursing notes, and pertinent available records from the EMR.  Social Determinants Affecting Complexity of Care: Patient has no clinically significant social determinants affecting this chief complaint..   ED Course:    Medical Decision Making Patient here with symptoms that seem consistent with gout. Hx of the same.  No trauma.  Afebrile.  Will check chem 8 because he's not had recent labs.  Will treat with colchicine and prednisone.    Encouraged patient to follow-up with his doctor about elevated BP and elevated creatinine.  Amount and/or Complexity of Data Reviewed Labs: ordered.    Details: Creatinine 1.4.  Estimated CrCl is 89 based on Cockcroft-Gault.  Risk Prescription drug management.     Consultants: No consultations were needed in caring for this patient.   Treatment and Plan: Emergency department workup does not suggest an emergent condition requiring admission or immediate intervention beyond  what has been performed at this time. The patient is safe for discharge and has  been instructed to return immediately for worsening symptoms, change in  symptoms or any other concerns    Final Clinical  Impressions(s) / ED Diagnoses     ICD-10-CM   1. Acute gout of left foot, unspecified cause  M10.9     2. Elevated blood pressure reading  R03.0     3. Elevated serum creatinine  R79.89       ED Discharge Orders          Ordered    colchicine 0.6 MG tablet  2 times daily        02/12/22 0143    predniSONE (DELTASONE) 20 MG tablet  Daily        02/12/22 0143              Discharge Instructions Discussed with and Provided to Patient:     Discharge Instructions      You need to have your kidney function rechecked by your doctor in 1-2 weeks.  You blood pressure was high.  Your kidney function was slightly worse than  normal.  Drink plenty of fluids.  Take medications as directed.       Roxy Horseman, PA-C 02/12/22 0737    Shon Baton, MD 02/12/22 [phone]

## 2022-02-12 NOTE — Telephone Encounter (Signed)
Pt called regarding which pharmacy Rx was e-scribed to.  RNCM reviewed chart to access After Visit Summary and found that Rx was sent to [facility] in [place]. Pt requests to have called in to [facility] in [facility].  RNCM called in Rx as written.

## 2022-02-12 NOTE — Discharge Instructions (Addendum)
You need to have your kidney function rechecked by your doctor in 1-2 weeks.  You blood pressure was high.  Your kidney function was slightly worse than normal.  Drink plenty of fluids.  Take medications as directed.

## 2022-07-21 ENCOUNTER — Other Ambulatory Visit (HOSPITAL_BASED_OUTPATIENT_CLINIC_OR_DEPARTMENT_OTHER): Payer: Self-pay | Admitting: Family Medicine

## 2022-07-21 ENCOUNTER — Ambulatory Visit (HOSPITAL_BASED_OUTPATIENT_CLINIC_OR_DEPARTMENT_OTHER)
Admission: RE | Admit: 2022-07-21 | Discharge: 2022-07-21 | Disposition: A | Discharge status: Home / Self Care | Payer: BC Managed Care – PPO | Source: Ambulatory Visit | Attending: Family Medicine | Admitting: Family Medicine

## 2022-07-21 DIAGNOSIS — R197 Diarrhea, unspecified: Secondary | ICD-10-CM | POA: Diagnosis not present

## 2022-07-21 DIAGNOSIS — M109 Gout, unspecified: Secondary | ICD-10-CM | POA: Diagnosis not present

## 2022-07-21 DIAGNOSIS — R140 Abdominal distension (gaseous): Secondary | ICD-10-CM | POA: Insufficient documentation

## 2022-11-07 ENCOUNTER — Ambulatory Visit
Admission: EM | Admit: 2022-11-07 | Discharge: 2022-11-07 | Disposition: A | Discharge status: Home / Self Care | Payer: BC Managed Care – PPO

## 2022-11-07 DIAGNOSIS — R21 Rash and other nonspecific skin eruption: Secondary | ICD-10-CM

## 2022-11-07 DIAGNOSIS — X58XXXA Exposure to other specified factors, initial encounter: Secondary | ICD-10-CM | POA: Diagnosis not present

## 2022-11-07 MED ORDER — PREDNISONE 50 MG PO TABS: ORAL_TABLET | ORAL | 0 refills | Status: AC

## 2022-11-07 MED ORDER — TRIAMCINOLONE ACETONIDE 0.1 % EX CREA: 1.0000 | TOPICAL_CREAM | Freq: Two times a day (BID) | CUTANEOUS | 0 refills | Status: AC

## 2022-11-07 MED ORDER — DEXAMETHASONE SODIUM PHOSPHATE 10 MG/ML IJ SOLN
10.0000 mg | INTRAMUSCULAR | Status: AC
  Administered 2022-11-07: 10 mg via INTRAMUSCULAR

## 2022-11-07 NOTE — ED Provider Notes (Signed)
[facility]    CSN: [id number] Arrival date & time: 11/07/22  0805      History   Chief Complaint No chief complaint on file.   HPI Devin Murphy is a 37 y.o. male.   The history is provided by the patient.   Patient presents for recent contact with poison oak.  Patient states he came into contact with the plant last evening.  He states last night, he started to develop itching.  He states this morning, he has a rash on his arms, ears, and has itching around his eye.  He denies fever, chills, chest pain, abdominal pain, nausea, vomiting, diarrhea, oozing, or drainage from the rash.  He states that he did have some calamine lotion at home that he used.  Patient states that he is not diabetic.  Past Medical History:  Diagnosis Date   Asthma    as a child    There are no problems to display for this patient.   History reviewed. No pertinent surgical history.     Home Medications    Prior to Admission medications   Medication Sig Start Date End Date Taking? Authorizing Provider  acetaminophen (TYLENOL) 325 MG tablet Take 975 mg by mouth every 6 (six) hours as needed.    [provider]  colchicine 0.6 MG tablet Take 1 tablet (0.6 mg total) by mouth 2 (two) times daily. 02/12/22   Roxy Horseman, PA-C  ibuprofen (ADVIL) 600 MG tablet Take 1 tablet (600 mg total) by mouth every 6 (six) hours as needed. 02/27/19   Domenick Gong, MD  indomethacin (INDOCIN) 50 MG capsule Take 50 mg by mouth 3 (three) times daily as needed.    [provider]  predniSONE (DELTASONE) 20 MG tablet Take 2 tablets (40 mg total) by mouth daily. 02/12/22   Roxy Horseman, PA-C    Family History Family History  Problem Relation Age of Onset   Healthy Mother    Diabetes Father     Social History Social History   Tobacco Use   Smoking status: Former    Current packs/day: 0.00    Types: Cigarettes    Quit date: 02/09/2014    Years since quitting: 8.7    Smokeless tobacco: Never  Substance Use Topics   Alcohol use: Yes    Comment: occ.   Drug use: No     Allergies   Penicillins and Morphine and codeine   Review of Systems Review of Systems Per HPI  Physical Exam Triage Vital Signs ED Triage Vitals  Encounter Vitals Group     BP 11/07/22 0810 (!) 139/94     Systolic BP Percentile --      Diastolic BP Percentile --      Pulse Rate 11/07/22 0810 67     Resp 11/07/22 0810 18     Temp 11/07/22 0810 (!) 97.5 F (36.4 C)     Temp Source 11/07/22 0810 Oral     SpO2 11/07/22 0810 96 %     Weight --      Height --      Head Circumference --      Peak Flow --      Pain Score 11/07/22 0811 0     Pain Loc --      Pain Education --      Exclude from Growth Chart --    No data found.  Updated Vital Signs BP (!) 139/94 (BP Location: Right Arm)   Pulse 67  Temp (!) 97.5 F (36.4 C) (Oral)   Resp 18   SpO2 96%   Visual Acuity Right Eye Distance:   Left Eye Distance:   Bilateral Distance:    Right Eye Near:   Left Eye Near:    Bilateral Near:     Physical Exam Vitals and nursing note reviewed.  Constitutional:      General: He is not in acute distress.    Appearance: Normal appearance.  HENT:     Head: Normocephalic.  Eyes:     Extraocular Movements: Extraocular movements intact.     Pupils: Pupils are equal, round, and reactive to light.  Cardiovascular:     Rate and Rhythm: Normal rate and regular rhythm.     Pulses: Normal pulses.     Heart sounds: Normal heart sounds.  Pulmonary:     Effort: Pulmonary effort is normal. No respiratory distress.     Breath sounds: Normal breath sounds. No stridor. No wheezing, rhonchi or rales.  Abdominal:     General: Bowel sounds are normal.     Palpations: Abdomen is soft.     Tenderness: There is no abdominal tenderness.  Musculoskeletal:     Cervical back: Normal range of motion.  Skin:    General: Skin is warm and dry.     Findings: Rash present. Rash is macular  and papular.     Comments: Maculopapular rash noted to the left forearm.  Rash is linear, with some patches.  There is no oozing, fluctuance, or drainage present.  No obvious rash present to the face, ears, or eyes.  Neurological:     General: No focal deficit present.     Mental Status: He is alert and oriented to person, place, and time.  Psychiatric:        Mood and Affect: Mood normal.        Behavior: Behavior normal.      UC Treatments / Results  Labs (all labs ordered are listed, but only abnormal results are displayed) Labs Reviewed - No data to display  EKG   Radiology No results found.  Procedures Procedures (including critical care time)  Medications Ordered in UC Medications - No data to display  Initial Impression / Assessment and Plan / UC Course  I have reviewed the triage vital signs and the nursing notes.  Pertinent labs & imaging results that were available during my care of the patient were reviewed by me and considered in my medical decision making (see chart for details).  The patient is well-appearing, he is in no acute distress, vital signs are stable.  Patient with recent contact with poison oak.  Will treat with Decadron 10 mg IM in the clinic.  Will start patient on prednisone 50 mg for the next 5 days along with triamcinolone cream 0.1% to apply topically as needed.  Supportive care recommendations were provided and discussed with the patient to include lukewarm baths or showers, cool cloths to help with itching, and use of Aveeno colloidal oatmeal bath.  Patient was advised to follow-up if symptoms do not improve.  Patient was in agreement with this plan of care and verbalizes understanding.  All questions were answered.  Patient stable for discharge.   Final Clinical Impressions(s) / UC Diagnoses   Final diagnoses:  None   Discharge Instructions   None    ED Prescriptions   None    PDMP not reviewed this encounter.   Abran Cantor, NP 11/07/22 [phone]

## 2022-11-07 NOTE — Discharge Instructions (Signed)
You were given an injection of Decadron 10 mg today.  Start prednisone on 11/08/2022. Take medication as prescribed. May also take over-the-counter Zyrtec during the daytime and Benadryl at bedtime to help with itching. Avoid hot baths or showers while symptoms persist.  Recommend taking lukewarm baths. May apply cool cloths to the area to help with itching or discomfort. Avoid scratching, rubbing, or manipulating the areas while symptoms persist. Recommend Aveeno colloidal oatmeal bath to use to help with drying and itching. Follow up if symptoms do not improve.

## 2022-11-07 NOTE — ED Triage Notes (Signed)
Pt reports he came in contact with poison oak and now has rashes on his arms, ears, and eye x 1 day

## 2022-12-17 DIAGNOSIS — M25521 Pain in right elbow: Secondary | ICD-10-CM | POA: Diagnosis not present

## 2023-06-10 DIAGNOSIS — M545 Low back pain, unspecified: Secondary | ICD-10-CM | POA: Diagnosis not present
